# Patient Record
Sex: Female | Born: 1950 | Race: Black or African American | Hispanic: No
Health system: Southern US, Community
[De-identification: ages and names within clinical notes are randomized; demographics above are authoritative.]

---

## 2017-11-01 ENCOUNTER — Emergency Department (HOSPITAL_COMMUNITY): Payer: No Typology Code available for payment source

## 2017-11-01 ENCOUNTER — Emergency Department (HOSPITAL_COMMUNITY)
Admission: EM | Admit: 2017-11-01 | Discharge: 2017-11-01 | Disposition: A | Discharge status: Home / Self Care | Payer: No Typology Code available for payment source | Attending: Emergency Medicine | Admitting: Emergency Medicine

## 2017-11-01 DIAGNOSIS — S32401A Unspecified fracture of right acetabulum, initial encounter for closed fracture: Secondary | ICD-10-CM

## 2017-11-01 DIAGNOSIS — Y929 Unspecified place or not applicable: Secondary | ICD-10-CM | POA: Insufficient documentation

## 2017-11-01 DIAGNOSIS — Y998 Other external cause status: Secondary | ICD-10-CM | POA: Insufficient documentation

## 2017-11-01 DIAGNOSIS — S79911A Unspecified injury of right hip, initial encounter: Secondary | ICD-10-CM | POA: Diagnosis present

## 2017-11-01 DIAGNOSIS — Y939 Activity, unspecified: Secondary | ICD-10-CM | POA: Diagnosis not present

## 2017-11-01 LAB — URINALYSIS, ROUTINE W REFLEX MICROSCOPIC
BILIRUBIN URINE: NEGATIVE
Bacteria, UA: NONE SEEN
Glucose, UA: NEGATIVE mg/dL
Ketones, ur: NEGATIVE mg/dL
LEUKOCYTES UA: NEGATIVE
NITRITE: NEGATIVE
PH: 6 (ref 5.0–8.0)
Protein, ur: NEGATIVE mg/dL
SPECIFIC GRAVITY, URINE: 1.009 (ref 1.005–1.030)

## 2017-11-01 LAB — SAMPLE TO BLOOD BANK

## 2017-11-01 LAB — COMPREHENSIVE METABOLIC PANEL
ALBUMIN: 4.2 g/dL (ref 3.5–5.0)
ALK PHOS: 80 U/L (ref 38–126)
ALT: 20 U/L (ref 14–54)
ANION GAP: 12 (ref 5–15)
AST: 33 U/L (ref 15–41)
BUN: 7 mg/dL (ref 6–20)
CO2: 19 mmol/L — AB (ref 22–32)
Calcium: 9.3 mg/dL (ref 8.9–10.3)
Chloride: 104 mmol/L (ref 101–111)
Creatinine, Ser: 0.85 mg/dL (ref 0.44–1.00)
GFR calc Af Amer: >60 mL/min (ref >60)
GFR calc non Af Amer: >60 mL/min (ref >60)
GLUCOSE: 155 mg/dL — AB (ref 65–99)
POTASSIUM: 3.4 mmol/L — AB (ref 3.5–5.1)
Sodium: 135 mmol/L (ref 135–145)
Total Bilirubin: 0.7 mg/dL (ref 0.3–1.2)
Total Protein: 7.3 g/dL (ref 6.5–8.1)

## 2017-11-01 LAB — CBC
HEMATOCRIT: 40.9 % (ref 36.0–46.0)
HEMOGLOBIN: 13.5 g/dL (ref 12.0–15.0)
MCH: 26.8 pg (ref 26.0–34.0)
MCHC: 33 g/dL (ref 30.0–36.0)
MCV: 81.3 fL (ref 78.0–100.0)
Platelets: 238 10*3/uL (ref 150–400)
RBC: 5.03 MIL/uL (ref 3.87–5.11)
RDW: 14.5 % (ref 11.5–15.5)
WBC: 10.2 10*3/uL (ref 4.0–10.5)

## 2017-11-01 MED ORDER — KETOROLAC TROMETHAMINE 30 MG/ML IJ SOLN
15.0000 mg | Freq: Once | INTRAMUSCULAR | Status: AC
  Administered 2017-11-01: 15 mg via INTRAVENOUS
  Filled 2017-11-01: qty 1

## 2017-11-01 MED ORDER — IOPAMIDOL (ISOVUE-300) INJECTION 61%
INTRAVENOUS | Status: AC
  Administered 2017-11-01: 100 mL via INTRAVENOUS
  Filled 2017-11-01: qty 100

## 2017-11-01 MED ORDER — TRAMADOL HCL 50 MG PO TABS: 50.0000 mg | ORAL_TABLET | Freq: Four times a day (QID) | ORAL | 0 refills | Status: AC | PRN

## 2017-11-01 MED ORDER — ONDANSETRON HCL 4 MG/2ML IJ SOLN
4.0000 mg | Freq: Once | INTRAMUSCULAR | Status: AC
  Administered 2017-11-01: 4 mg via INTRAVENOUS
  Filled 2017-11-01: qty 2

## 2017-11-01 MED ORDER — HYDROMORPHONE HCL 1 MG/ML IJ SOLN
0.5000 mg | Freq: Once | INTRAMUSCULAR | Status: AC
  Administered 2017-11-01: 0.5 mg via INTRAVENOUS
  Filled 2017-11-01: qty 1

## 2017-11-01 MED ORDER — POTASSIUM CHLORIDE CRYS ER 20 MEQ PO TBCR
20.0000 meq | EXTENDED_RELEASE_TABLET | Freq: Once | ORAL | Status: AC
  Administered 2017-11-01: 20 meq via ORAL
  Filled 2017-11-01: qty 1

## 2017-11-01 MED ORDER — LORAZEPAM 2 MG/ML IJ SOLN
0.5000 mg | Freq: Once | INTRAMUSCULAR | Status: DC
Start: 2017-11-01 — End: 2017-11-01
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
## 2017-11-01 NOTE — ED Notes (Signed)
Patient returned from CT, placed back on monitor.

## 2017-11-01 NOTE — ED Notes (Signed)
Patient transported to CT 

## 2017-11-01 NOTE — ED Notes (Signed)
Provided patient with ginger ale and crackers.

## 2017-11-01 NOTE — Discharge Instructions (Addendum)
It was our pleasure to provide your ER care today - we hope that you feel better.  Take motrin or aleve as need for pain. You may also take ultram as need for pain - no driving when taking.  Follow up with orthopedic doctor in the coming week - call office Monday to arrange appointment.   Return to ER if worse, new symptoms, new or severe pain, other concern.

## 2017-11-01 NOTE — ED Notes (Signed)
Patient's BP dropped following pain medication administration - pt became nauseated and diaphoretic - HOB lowered, room temp lowered, and pt given Zofran. MD aware.
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
## 2017-11-04 ENCOUNTER — Telehealth: Payer: Self-pay | Admitting: *Deleted
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
